# Patient Record
Sex: Female | Born: 1935 | Race: White | Hispanic: No
Health system: Southern US, Community
[De-identification: ages and names within clinical notes are randomized; demographics above are authoritative.]

---

## 2004-12-06 ENCOUNTER — Ambulatory Visit: Payer: Self-pay | Admitting: Internal Medicine

## 2005-06-20 ENCOUNTER — Inpatient Hospital Stay: Payer: Self-pay | Admitting: Internal Medicine

## 2005-06-22 ENCOUNTER — Other Ambulatory Visit: Payer: Self-pay

## 2005-07-28 ENCOUNTER — Other Ambulatory Visit: Payer: Self-pay

## 2005-07-28 ENCOUNTER — Emergency Department: Payer: Self-pay | Admitting: Emergency Medicine

## 2005-12-10 ENCOUNTER — Other Ambulatory Visit: Payer: Self-pay

## 2005-12-10 ENCOUNTER — Emergency Department: Payer: Self-pay

## 2006-02-06 ENCOUNTER — Ambulatory Visit: Payer: Self-pay | Admitting: Internal Medicine

## 2006-05-06 ENCOUNTER — Other Ambulatory Visit: Payer: Self-pay

## 2006-05-06 ENCOUNTER — Ambulatory Visit: Payer: Self-pay | Admitting: Urology

## 2006-05-12 ENCOUNTER — Ambulatory Visit: Payer: Self-pay | Admitting: Urology

## 2007-03-09 ENCOUNTER — Ambulatory Visit: Payer: Self-pay | Admitting: Unknown Physician Specialty

## 2007-07-14 ENCOUNTER — Emergency Department: Payer: Self-pay | Admitting: Emergency Medicine

## 2007-08-18 ENCOUNTER — Ambulatory Visit: Payer: Self-pay | Admitting: Internal Medicine

## 2007-12-01 ENCOUNTER — Ambulatory Visit: Payer: Self-pay

## 2008-05-21 ENCOUNTER — Inpatient Hospital Stay: Payer: Self-pay | Admitting: Internal Medicine

## 2008-07-26 ENCOUNTER — Ambulatory Visit: Payer: Self-pay | Admitting: Otolaryngology

## 2008-11-08 ENCOUNTER — Ambulatory Visit: Payer: Self-pay | Admitting: Internal Medicine

## 2008-12-27 ENCOUNTER — Inpatient Hospital Stay: Payer: Self-pay | Admitting: Internal Medicine

## 2009-07-18 ENCOUNTER — Encounter: Admission: RE | Admit: 2009-07-18 | Discharge: 2009-07-18 | Payer: Self-pay | Admitting: Neurology

## 2009-08-08 ENCOUNTER — Ambulatory Visit: Payer: Self-pay | Admitting: Family Medicine

## 2009-11-23 ENCOUNTER — Ambulatory Visit: Payer: Self-pay | Admitting: Family Medicine

## 2010-02-20 ENCOUNTER — Ambulatory Visit: Payer: Self-pay | Admitting: Family Medicine

## 2010-12-30 ENCOUNTER — Ambulatory Visit: Payer: Self-pay | Admitting: Family Medicine

## 2011-06-11 ENCOUNTER — Encounter: Payer: Self-pay | Admitting: Neurology

## 2011-06-18 ENCOUNTER — Encounter: Payer: Self-pay | Admitting: Neurology

## 2011-07-19 ENCOUNTER — Encounter: Payer: Self-pay | Admitting: Neurology

## 2012-01-14 ENCOUNTER — Ambulatory Visit: Payer: Self-pay | Admitting: Family Medicine

## 2013-01-17 ENCOUNTER — Ambulatory Visit: Payer: Self-pay | Admitting: Family Medicine

## 2013-05-16 ENCOUNTER — Ambulatory Visit: Payer: Self-pay | Admitting: Cardiology

## 2013-05-16 DIAGNOSIS — I4891 Unspecified atrial fibrillation: Secondary | ICD-10-CM

## 2013-05-16 DIAGNOSIS — I10 Essential (primary) hypertension: Secondary | ICD-10-CM

## 2013-05-16 LAB — CBC WITH DIFFERENTIAL/PLATELET
BASOS PCT: 0.7 %
Basophil #: 0 10*3/uL (ref 0.0–0.1)
EOS PCT: 2.3 %
Eosinophil #: 0.2 10*3/uL (ref 0.0–0.7)
HCT: 39.2 % (ref 35.0–47.0)
HGB: 12.7 g/dL (ref 12.0–16.0)
Lymphocyte #: 1.8 10*3/uL (ref 1.0–3.6)
Lymphocyte %: 24.7 %
MCH: 32.3 pg (ref 26.0–34.0)
MCHC: 32.4 g/dL (ref 32.0–36.0)
MCV: 100 fL (ref 80–100)
MONO ABS: 0.7 x10 3/mm (ref 0.2–0.9)
Monocyte %: 9.9 %
Neutrophil #: 4.5 10*3/uL (ref 1.4–6.5)
Neutrophil %: 62.4 %
Platelet: 129 10*3/uL — ABNORMAL LOW (ref 150–440)
RBC: 3.93 10*6/uL (ref 3.80–5.20)
RDW: 13.4 % (ref 11.5–14.5)
WBC: 7.2 10*3/uL (ref 3.6–11.0)

## 2013-05-16 LAB — PROTIME-INR
INR: 3
PROTHROMBIN TIME: 30.3 s — AB (ref 11.5–14.7)

## 2013-05-16 LAB — BASIC METABOLIC PANEL
Anion Gap: 4 — ABNORMAL LOW (ref 7–16)
BUN: 22 mg/dL — ABNORMAL HIGH (ref 7–18)
CALCIUM: 10.1 mg/dL (ref 8.5–10.1)
CREATININE: 1.6 mg/dL — AB (ref 0.60–1.30)
Chloride: 106 mmol/L (ref 98–107)
Co2: 32 mmol/L (ref 21–32)
EGFR (African American): 33 — ABNORMAL LOW
EGFR (Non-African Amer.): 28 — ABNORMAL LOW
GLUCOSE: 90 mg/dL (ref 65–99)
OSMOLALITY: 286 (ref 275–301)
Potassium: 3.7 mmol/L (ref 3.5–5.1)
Sodium: 142 mmol/L (ref 136–145)

## 2013-05-16 LAB — APTT: Activated PTT: 41.9 secs — ABNORMAL HIGH (ref 23.6–35.9)

## 2013-05-24 ENCOUNTER — Ambulatory Visit: Payer: Self-pay | Admitting: Cardiology

## 2013-05-24 LAB — PROTIME-INR
INR: 1.3
PROTHROMBIN TIME: 15.5 s — AB (ref 11.5–14.7)

## 2013-09-29 ENCOUNTER — Emergency Department: Payer: Self-pay | Admitting: Emergency Medicine

## 2013-09-29 LAB — URINALYSIS, COMPLETE
BACTERIA: NONE SEEN
Bilirubin,UR: NEGATIVE
Glucose,UR: NEGATIVE mg/dL (ref 0–75)
Ketone: NEGATIVE
Leukocyte Esterase: NEGATIVE
NITRITE: NEGATIVE
Ph: 5 (ref 4.5–8.0)
Protein: NEGATIVE
Specific Gravity: 1.011 (ref 1.003–1.030)
Squamous Epithelial: NONE SEEN

## 2013-09-29 LAB — COMPREHENSIVE METABOLIC PANEL
ALBUMIN: 3.1 g/dL — AB (ref 3.4–5.0)
ALT: 23 U/L
Alkaline Phosphatase: 140 U/L — ABNORMAL HIGH
Anion Gap: 9 (ref 7–16)
BILIRUBIN TOTAL: 0.5 mg/dL (ref 0.2–1.0)
BUN: 41 mg/dL — ABNORMAL HIGH (ref 7–18)
CHLORIDE: 109 mmol/L — AB (ref 98–107)
CO2: 24 mmol/L (ref 21–32)
CREATININE: 2.15 mg/dL — AB (ref 0.60–1.30)
Calcium, Total: 10.4 mg/dL — ABNORMAL HIGH (ref 8.5–10.1)
EGFR (African American): 23 — ABNORMAL LOW
EGFR (Non-African Amer.): 20 — ABNORMAL LOW
Glucose: 103 mg/dL — ABNORMAL HIGH (ref 65–99)
Osmolality: 293 (ref 275–301)
Potassium: 3.9 mmol/L (ref 3.5–5.1)
SGOT(AST): 25 U/L (ref 15–37)
Sodium: 142 mmol/L (ref 136–145)
Total Protein: 8.2 g/dL (ref 6.4–8.2)

## 2013-09-29 LAB — PROTIME-INR
INR: 1.8
Prothrombin Time: 20.1 secs — ABNORMAL HIGH (ref 11.5–14.7)

## 2013-09-29 LAB — CBC
HCT: 38 % (ref 35.0–47.0)
HGB: 12.2 g/dL (ref 12.0–16.0)
MCH: 32.5 pg (ref 26.0–34.0)
MCHC: 32.1 g/dL (ref 32.0–36.0)
MCV: 101 fL — AB (ref 80–100)
Platelet: 145 10*3/uL — ABNORMAL LOW (ref 150–440)
RBC: 3.74 10*6/uL — AB (ref 3.80–5.20)
RDW: 13.7 % (ref 11.5–14.5)
WBC: 10.6 10*3/uL (ref 3.6–11.0)

## 2013-09-29 LAB — PRO B NATRIURETIC PEPTIDE: B-Type Natriuretic Peptide: 2885 pg/mL — ABNORMAL HIGH (ref 0–450)

## 2013-09-29 LAB — TROPONIN I

## 2013-10-01 LAB — URINE CULTURE

## 2014-06-10 NOTE — Op Note (Signed)
PATIENT NAME:  Kelsey Harrison, [name] MR#:  [id number] DATE OF BIRTH:  03-18-1921  DATE OF PROCEDURE:  05/24/2013  PRIMARY CARE PHYSICIAN:  Dr. Elease HashimotoMaloney.   PREPROCEDURE DIAGNOSES: 1.  Sick sinus syndrome.  2.  Elective replacement indication.   PROCEDURE:  Dual-chamber pacemaker generator change out.   POSTPROCEDURE DIAGNOSIS:  Intermittent ventricular pacing.   INDICATION:  The patient is an 81 year old female with known history of sick sinus syndrome, status post pacemaker. The patient underwent recent pacemaker interrogation which showed the pacemaker was at elective replacement indication with a slow ventricular escape rhythm. Procedure, risks, benefits and alternatives of pacemaker generator change out were explained to the patient and informed written consent was obtained.   She was brought to the operating room in a fasting state. The left pectoral region was prepped and draped in the usual sterile manner. Anesthesia was obtained with 1% lidocaine locally. A 6 cm incision was performed over the left pectoral region. The pacemaker pocket was retrieved by electrocautery and blunt dissection. The leads were disconnected from the old pacemaker generator and connected to a new dual-chamber rate responsive pacemaker generator. Pacemaker pocket was irrigated with gentamicin solution. The new pacemaker generator was positioned into the pocket. The pocket was closed with 2-0 and 4-0 Vicryl, respectively. Steri-Strips and pressure dressing were applied.    ____________________________ Marcina MillardAlexander Rakwon Letourneau, MD ap:dmm D: 05/24/2013 12:19:47 ET T: 05/24/2013 12:41:11 ET JOB#: [id number]  cc: Marcina MillardAlexander Irina Okelly, MD, <Dictator> Marcina MillardALEXANDER Bryona Foxworthy MD ELECTRONICALLY SIGNED 06/21/2013 12:43

## 2017-12-30 ENCOUNTER — Other Ambulatory Visit: Payer: Self-pay | Admitting: Student

## 2017-12-30 DIAGNOSIS — R1319 Other dysphagia: Secondary | ICD-10-CM

## 2017-12-30 DIAGNOSIS — R1310 Dysphagia, unspecified: Secondary | ICD-10-CM

## 2018-01-01 ENCOUNTER — Ambulatory Visit: Payer: PRIVATE HEALTH INSURANCE

## 2018-01-11 ENCOUNTER — Ambulatory Visit: Payer: PRIVATE HEALTH INSURANCE

## 2018-02-01 ENCOUNTER — Ambulatory Visit
Admission: RE | Admit: 2018-02-01 | Discharge: 2018-02-01 | Disposition: A | Discharge status: Home / Self Care | Payer: Medicare Other | Source: Ambulatory Visit | Attending: Student | Admitting: Student

## 2018-02-01 DIAGNOSIS — R1310 Dysphagia, unspecified: Secondary | ICD-10-CM | POA: Insufficient documentation

## 2018-02-01 DIAGNOSIS — R1319 Other dysphagia: Secondary | ICD-10-CM

## 2018-02-03 ENCOUNTER — Other Ambulatory Visit: Payer: Self-pay | Admitting: Student

## 2018-02-03 DIAGNOSIS — R1310 Dysphagia, unspecified: Secondary | ICD-10-CM

## 2018-02-03 DIAGNOSIS — R933 Abnormal findings on diagnostic imaging of other parts of digestive tract: Secondary | ICD-10-CM

## 2018-03-01 ENCOUNTER — Ambulatory Visit
Admission: RE | Admit: 2018-03-01 | Discharge: 2018-03-01 | Disposition: A | Discharge status: Home / Self Care | Payer: Medicare Other | Source: Ambulatory Visit | Attending: Student | Admitting: Student

## 2018-03-01 DIAGNOSIS — R1312 Dysphagia, oropharyngeal phase: Secondary | ICD-10-CM | POA: Diagnosis not present

## 2018-03-01 DIAGNOSIS — R933 Abnormal findings on diagnostic imaging of other parts of digestive tract: Secondary | ICD-10-CM | POA: Diagnosis present

## 2018-03-01 NOTE — Therapy (Signed)
[facility] DIAGNOSTIC RADIOLOGY [address] [place], [place], [address] Phone: [phone]   Fax:     Modified Barium Swallow  Patient Details  Name: Kelsey Harrison MRN: [id number] Date of Birth: [birth year] No data recorded  Encounter Date: 03/01/2018  End of Session - 03/01/18 1328    Visit Number  1    Number of Visits  1    Date for SLP Re-Evaluation  03/01/18    SLP Start Time  1240    SLP Stop Time   1328    SLP Time Calculation (min)  48 min    Activity Tolerance  Patient tolerated treatment well       No past medical history on file.    There were no vitals filed for this visit.    Subjective: Patient behavior: (alertness, ability to follow instructions, etc.):  The patient is alert and oriented to swallowing.  She is able to follow simple directions.  She cannot coherently verbalize her swallowing complaints.  Chief complaint: patient reports burning in throat.  Patient's family report regurgitation, early satiety, foods/pills getting stuck   Objective:  Radiological Procedure: A videoflouroscopic evaluation of oral-preparatory, reflex initiation, and pharyngeal phases of the swallow was performed; as well as a screening of the upper esophageal phase.  I. POSTURE: Upright in MBS chair  II. VIEW: Lateral  III. COMPENSATORY STRATEGIES: N/A  IV. BOLUSES ADMINISTERED:   Thin Liquid: 3 consecutive   Nectar-thick Liquid: 2 consecutive   Honey-thick Liquid: DNT   Puree: 2 teaspoon presentations   Mechanical Soft: 1/4 graham cracker crumbled in applesauce  V. RESULTS OF EVALUATION: A. ORAL PREPARATORY PHASE: (The lips, tongue, and velum are observed for strength and coordination)       **Overall Severity Rating: Mild; disorganization appears to be primarily due to ill-fitting dentures  B. SWALLOW INITIATION/REFLEX: (The reflex is normal if "triggered" by the time the bolus reached the base of the tongue)  **Overall  Severity Rating: Mild-moderate; triggers while falling from the valleculae to the pyriform sinuses  C. PHARYNGEAL PHASE: (Pharyngeal function is normal if the bolus shows rapid, smooth, and continuous transit through the pharynx and there is no pharyngeal residue after the swallow)  **Overall Severity Rating: Mild; reduced pharyngeal pressure generation (reduced tongue base retraction, reduced hyolaryngeal excursion) with moderate vallecular residue with solids and mild with liquids  D. LARYNGEAL PENETRATION: (Material entering into the laryngeal inlet/vestibule but not aspirated) NONE  E. ASPIRATION: NONE  F. ESOPHAGEAL PHASE: (Screening of the upper esophagus) difficult to view cervical esophagus due to body habitus  ASSESSMENT: This 85 year old woman; with aspiration during barium swallow study; is presenting with mild oropharyngeal dysphagia characterized by disorganized oral phase secondary ill-fitting dentures, delayed pharyngeal swallow initiation, reduced pharyngeal pressure generation, and moderate vallecular residue with solids (less with liquid).  There is no observed laryngeal penetration or tracheal aspiration.  The patient is not at significant risk for prandial aspiration under normal eating situations.  The patient does NOT require a chin tuck to prevent aspiration.  The patient's complaints- early satiety, regurgitation, foods/pills sticking, burning in throat- may represent esophageal symptoms.  Discussed with the patient's family basic swallowing precautions including: small more frequent meals, softer and moister foods, be upright for meals, stay upright for an hour after meals, meds crushed in puree with plenty of fluid, alternate solids and liquids.  PLAN/RECOMMENDATIONS:   A. Diet: Dysphagia 2-3 diet with thin liquids; soften/moisten foods for ease in swallowing  B. Swallowing Precautions: small more frequent meals, softer and moister foods, be upright for meals, stay upright  for an hour after meals, meds crushed in puree with plenty of fluid, alternate solids and liquids.   C. Recommended consultation to: May benefit form referral to ENT   D. Therapy recommendations: none at this time   E. Results and recommendations were discussed with the patient's family immediately following the study and the final report routed to the referring practitioner and the patient's PCP.    Oropharyngeal dysphagia - Plan: DG OP Swallowing Func-Medicare/Speech Path, DG OP Swallowing Func-Medicare/Speech Path  Abnormal barium swallow - Plan: DG OP Swallowing Func-Medicare/Speech Path, DG OP Swallowing Func-Medicare/Speech Path        Problem List There are no active problems to display for this patient.  Kelsey PrimroseSusan G Coby Antrobus, MS/CCC- SLP  Leandrew KoyanagiAbernathy, Kelsey 03/01/2018, 1:29 PM  [facility] DIAGNOSTIC RADIOLOGY [address] [place], [place], [id number] Phone: [phone]   Fax:     Name: Kelsey MakoMabel I Harrison MRN: [id number] Date of Birth: 08/13/1921

## 2019-07-10 ENCOUNTER — Emergency Department
Admission: EM | Admit: 2019-07-10 | Discharge: 2019-07-10 | Disposition: A | Discharge status: Other Acute Inpatient Hospital | Payer: Medicare Other | Attending: Student | Admitting: Student

## 2019-07-10 ENCOUNTER — Emergency Department: Payer: Medicare Other

## 2019-07-10 ENCOUNTER — Other Ambulatory Visit: Payer: Self-pay

## 2019-07-10 DIAGNOSIS — I4891 Unspecified atrial fibrillation: Secondary | ICD-10-CM | POA: Diagnosis not present

## 2019-07-10 DIAGNOSIS — S300XXA Contusion of lower back and pelvis, initial encounter: Secondary | ICD-10-CM | POA: Diagnosis not present

## 2019-07-10 DIAGNOSIS — N189 Chronic kidney disease, unspecified: Secondary | ICD-10-CM | POA: Diagnosis not present

## 2019-07-10 DIAGNOSIS — Z20822 Contact with and (suspected) exposure to covid-19: Secondary | ICD-10-CM | POA: Insufficient documentation

## 2019-07-10 DIAGNOSIS — S32591A Other specified fracture of right pubis, initial encounter for closed fracture: Secondary | ICD-10-CM | POA: Insufficient documentation

## 2019-07-10 DIAGNOSIS — Y92122 Bedroom in nursing home as the place of occurrence of the external cause: Secondary | ICD-10-CM | POA: Diagnosis not present

## 2019-07-10 DIAGNOSIS — Y9301 Activity, walking, marching and hiking: Secondary | ICD-10-CM | POA: Insufficient documentation

## 2019-07-10 DIAGNOSIS — W19XXXA Unspecified fall, initial encounter: Secondary | ICD-10-CM | POA: Diagnosis not present

## 2019-07-10 DIAGNOSIS — Z7901 Long term (current) use of anticoagulants: Secondary | ICD-10-CM | POA: Insufficient documentation

## 2019-07-10 DIAGNOSIS — Y999 Unspecified external cause status: Secondary | ICD-10-CM | POA: Insufficient documentation

## 2019-07-10 DIAGNOSIS — S329XXA Fracture of unspecified parts of lumbosacral spine and pelvis, initial encounter for closed fracture: Secondary | ICD-10-CM

## 2019-07-10 DIAGNOSIS — N9489 Other specified conditions associated with female genital organs and menstrual cycle: Secondary | ICD-10-CM

## 2019-07-10 DIAGNOSIS — S79911A Unspecified injury of right hip, initial encounter: Secondary | ICD-10-CM | POA: Diagnosis present

## 2019-07-10 DIAGNOSIS — Z950 Presence of cardiac pacemaker: Secondary | ICD-10-CM | POA: Insufficient documentation

## 2019-07-10 DIAGNOSIS — I129 Hypertensive chronic kidney disease with stage 1 through stage 4 chronic kidney disease, or unspecified chronic kidney disease: Secondary | ICD-10-CM | POA: Diagnosis not present

## 2019-07-10 LAB — HEMOGLOBIN AND HEMATOCRIT, BLOOD
HCT: 27.8 % — ABNORMAL LOW (ref 36.0–46.0)
Hemoglobin: 8.3 g/dL — ABNORMAL LOW (ref 12.0–15.0)

## 2019-07-10 LAB — CBC WITH DIFFERENTIAL/PLATELET
Abs Immature Granulocytes: 0.12 10*3/uL — ABNORMAL HIGH (ref 0.00–0.07)
Basophils Absolute: 0 10*3/uL (ref 0.0–0.1)
Basophils Relative: 0 %
Eosinophils Absolute: 0.1 10*3/uL (ref 0.0–0.5)
Eosinophils Relative: 1 %
HCT: 39.8 % (ref 36.0–46.0)
Hemoglobin: 12.7 g/dL (ref 12.0–15.0)
Immature Granulocytes: 2 %
Lymphocytes Relative: 31 %
Lymphs Abs: 2.4 10*3/uL (ref 0.7–4.0)
MCH: 31.6 pg (ref 26.0–34.0)
MCHC: 31.9 g/dL (ref 30.0–36.0)
MCV: 99 fL (ref 80.0–100.0)
Monocytes Absolute: 0.2 10*3/uL (ref 0.1–1.0)
Monocytes Relative: 3 %
Neutro Abs: 4.9 10*3/uL (ref 1.7–7.7)
Neutrophils Relative %: 63 %
Platelets: 113 10*3/uL — ABNORMAL LOW (ref 150–400)
RBC: 4.02 MIL/uL (ref 3.87–5.11)
RDW: 13.6 % (ref 11.5–15.5)
WBC: 7.6 10*3/uL (ref 4.0–10.5)
nRBC: 0 % (ref 0.0–0.2)

## 2019-07-10 LAB — BASIC METABOLIC PANEL
Anion gap: 9 (ref 5–15)
BUN: 25 mg/dL — ABNORMAL HIGH (ref 8–23)
CO2: 23 mmol/L (ref 22–32)
Calcium: 9.5 mg/dL (ref 8.9–10.3)
Chloride: 105 mmol/L (ref 98–111)
Creatinine, Ser: 1.51 mg/dL — ABNORMAL HIGH (ref 0.44–1.00)
GFR calc Af Amer: 34 mL/min — ABNORMAL LOW (ref >60)
GFR calc non Af Amer: 29 mL/min — ABNORMAL LOW (ref >60)
Glucose, Bld: 102 mg/dL — ABNORMAL HIGH (ref 70–99)
Potassium: 4.7 mmol/L (ref 3.5–5.1)
Sodium: 137 mmol/L (ref 135–145)

## 2019-07-10 LAB — PROTIME-INR
INR: 2.2 — ABNORMAL HIGH (ref 0.8–1.2)
Prothrombin Time: 23.6 seconds — ABNORMAL HIGH (ref 11.4–15.2)

## 2019-07-10 LAB — PREPARE RBC (CROSSMATCH)

## 2019-07-10 LAB — ABO/RH: ABO/RH(D): A NEG

## 2019-07-10 LAB — SARS CORONAVIRUS 2 BY RT PCR (HOSPITAL ORDER, PERFORMED IN ~~LOC~~ HOSPITAL LAB): SARS Coronavirus 2: NEGATIVE

## 2019-07-10 MED ORDER — VITAMIN K1 10 MG/ML IJ SOLN
5.0000 mg | Freq: Once | INTRAVENOUS | Status: DC
  Filled 2019-07-10: qty 0.5

## 2019-07-10 MED ORDER — SODIUM CHLORIDE 0.9 % IV SOLN
50.00 | INTRAVENOUS | Status: DC
Start: ? — End: 2019-07-10

## 2019-07-10 MED ORDER — METOPROLOL TARTRATE 5 MG/5ML IV SOLN: 5.0000 mg | Freq: Once | INTRAVENOUS | Status: DC

## 2019-07-10 MED ORDER — DILTIAZEM HCL-DEXTROSE 125-5 MG/125ML-% IV SOLN (PREMIX)
5.0000 mg/h | INTRAVENOUS | Status: DC
  Administered 2019-07-10: 5 mg/h via INTRAVENOUS
  Filled 2019-07-10: qty 125

## 2019-07-10 MED ORDER — DILTIAZEM HCL 25 MG/5ML IV SOLN
10.0000 mg | Freq: Once | INTRAVENOUS | Status: AC
  Administered 2019-07-10: 10 mg via INTRAVENOUS
  Filled 2019-07-10: qty 5

## 2019-07-10 MED ORDER — LACTATED RINGERS IV SOLN
75.00 | INTRAVENOUS | Status: DC
Start: ? — End: 2019-07-10

## 2019-07-10 MED ORDER — ONDANSETRON HCL 4 MG/2ML IJ SOLN
4.0000 mg | Freq: Once | INTRAMUSCULAR | Status: AC
  Administered 2019-07-10: 4 mg via INTRAVENOUS
  Filled 2019-07-10: qty 2

## 2019-07-10 MED ORDER — SODIUM CHLORIDE 0.9% IV SOLUTION
Freq: Once | INTRAVENOUS | Status: AC
  Filled 2019-07-10: qty 250

## 2019-07-10 MED ORDER — MORPHINE SULFATE (PF) 2 MG/ML IV SOLN
2.0000 mg | Freq: Once | INTRAVENOUS | Status: AC
  Administered 2019-07-10: 2 mg via INTRAVENOUS
  Filled 2019-07-10: qty 1

## 2019-07-10 MED ORDER — FENTANYL CITRATE (PF) 50 MCG/ML IJ SOLN
25.00 | INTRAMUSCULAR | Status: DC
Start: ? — End: 2019-07-10

## 2019-07-10 MED ORDER — ACETAMINOPHEN 500 MG PO TABS
500.00 | ORAL_TABLET | ORAL | Status: DC
Start: ? — End: 2019-07-10

## 2019-07-10 MED ORDER — SODIUM CHLORIDE 0.9 % IV BOLUS
1000.0000 mL | Freq: Once | INTRAVENOUS | Status: AC
  Administered 2019-07-10: 1000 mL via INTRAVENOUS

## 2019-07-10 MED ORDER — METOPROLOL TARTRATE 25 MG PO TABS
25.0000 mg | ORAL_TABLET | Freq: Once | ORAL | Status: AC
  Administered 2019-07-10: 25 mg via ORAL
  Filled 2019-07-10: qty 1

## 2019-07-10 MED ORDER — DILTIAZEM HCL 60 MG PO TABS
30.0000 mg | ORAL_TABLET | Freq: Once | ORAL | Status: AC
  Administered 2019-07-10: 30 mg via ORAL
  Filled 2019-07-10: qty 1

## 2019-07-10 MED ORDER — MAGNESIUM SULFATE 2 GM/50ML IV SOLN
2.0000 g | Freq: Once | INTRAVENOUS | Status: AC
  Administered 2019-07-10: 2 g via INTRAVENOUS
  Filled 2019-07-10: qty 50

## 2019-07-10 NOTE — ED Provider Notes (Signed)
[facility] Emergency Department Provider Note  ____________________________________________  Time seen: Approximately 6:12 AM  I have reviewed the triage vital signs and the nursing notes.   HISTORY  Chief Complaint Fall and Hip Pain   HPI Kelsey Harrison is a 86 y.o. female with history of chronic kidney disease, sick sinus syndrome status post pacemaker, atrial fibrillation on Coumadin, hypertension who presents  for evaluation of right hip pain status post mechanical fall.  Patient reports that she got up to go to the bathroom.  Her legs gave out and she fell onto the right hip.  She is complaining of pain in the right hip and right elbow.  She denies hitting her head, LOC, neck pain or back pain.  Her pain is mild but becomes moderate with movement of the hip.  She denies feeling dizzy and reports that her fall was mechanical in nature.  Patient has no history of dementia.  PMH Chronic kidney disease 09/29/2013  Major depressive disorder, single episode 09/29/2013  Hypertension 09/09/2013  Sick sinus syndrome 09/09/2013  Last Assessment & Plan:   Formatting of this note might be different from the original. Med[employer]onic Adapta ADDR06 pacemaker in the vvi mode lower rate of 50, ADL rate of 95.   Arthritis 09/09/2013  A-fib      Allergies Inderal [propranolol] and Sulfa antibiotics  FH Myocardial Infarction (Heart attack) Father    Vaginal cancer Mother       Social History Smoking - never Alcohol - no Drugs - no  Review of Systems  Constitutional: Negative for fever. Eyes: Negative for visual changes. ENT: Negative for facial injury or neck injury Cardiovascular: Negative for chest injury. Respiratory: Negative for shortness of breath. Negative for chest wall injury. Gastrointestinal: Negative for abdominal pain or injury. Genitourinary: Negative for dysuria. Musculoskeletal: Negative for back injury, + R hip pain and R elbow  pain Skin: Negative for laceration/abrasions. Neurological: Negative for head injury.   ____________________________________________   PHYSICAL EXAM:  VITAL SIGNS: ED Triage Vitals  Enc Vitals Group     BP 07/10/19 0509 (!) 148/96     Pulse Rate 07/10/19 0509 (!) 108     Resp 07/10/19 0509 (!) 22     Temp 07/10/19 0509 98.2 F (36.8 C)     Temp Source 07/10/19 0509 Oral     SpO2 07/10/19 0509 95 %     Weight 07/10/19 0505 83 lb (37.6 kg)     Height 07/10/19 0505 5' (1.524 m)     Head Circumference --      Peak Flow --      Pain Score --      Pain Loc --      Pain Edu? --      Excl. in GC? --     Full spinal precautions maintained throughout the trauma exam. Constitutional: Alert and oriented. No acute distress. Does not appear intoxicated. HEENT Head: Normocephalic and atraumatic. Face: No facial bony tenderness. Stable midface Ears: No hemotympanum bilaterally. No Battle sign Eyes: No eye injury. PERRL. No raccoon eyes Nose: Nontender. No epistaxis. No rhinorrhea Mouth/Throat: Mucous membranes are moist. No oropharyngeal blood. No dental injury. Airway patent without stridor. Normal voice. Neck: no C-collar. No midline c-spine tenderness.  Cardiovascular: Irregularly irregular rhythm with tachycardic rate  pulmonary/Chest: Chest wall is stable and nontender to palpation/compression. Normal respiratory effort. Breath sounds are normal. No crepitus.  Abdominal: Soft, nontender, non distended. Musculoskeletal: Tenderness to palpation of the right hip.  Bruising of the right elbow.  nontender with normal full range of motion in all other extremities. No deformities. No thoracic or lumbar midline spinal tenderness. Pelvis is stable. Skin: Skin is warm, dry and intact. No abrasions or contutions. Psychiatric: Speech and behavior are appropriate. Neurological: Normal speech and language. Moves all extremities to command. No gross focal neurologic deficits are  appreciated.  Glascow Coma Score: 4 - Opens eyes on own 6 - Follows simple motor commands 5 - Alert and oriented GCS: 15   ____________________________________________   LABS (all labs ordered are listed, but only abnormal results are displayed)  Labs Reviewed  CBC WITH DIFFERENTIAL/PLATELET - Abnormal; Notable for the following components:      Result Value   Platelets 113 (*)    Abs Immature Granulocytes 0.12 (*)    All other components within normal limits  PROTIME-INR - Abnormal; Notable for the following components:   Prothrombin Time 23.6 (*)    INR 2.2 (*)    All other components within normal limits  BASIC METABOLIC PANEL - Abnormal; Notable for the following components:   Glucose, Bld 102 (*)    BUN 25 (*)    Creatinine, Ser 1.51 (*)    GFR calc non Af Amer 29 (*)    GFR calc Af Amer 34 (*)    All other components within normal limits  URINALYSIS, COMPLETE (UACMP) WITH MICROSCOPIC  TYPE AND SCREEN   ____________________________________________  EKG  ED ECG REPORT I, Rudene Re, the attending physician, personally viewed and interpreted this ECG.  A. fib with rate of 112, normal QTC, right axis deviation, anterior Q waves.   ____________________________________________  RADIOLOGY  I have personally reviewed the images performed during this visit and I agree with the Radiologist's read.   Interpretation by Radiologist:  DG Elbow Complete Right  Result Date: 07/10/2019 CLINICAL DATA:  Initial evaluation for acute trauma, fall. EXAM: RIGHT ELBOW - COMPLETE 3+ VIEW COMPARISON:  None. FINDINGS: No acute fracture dislocation. No visible joint effusion. Mild degenerative spurring noted about the elbow. Osteopenia. No visible soft tissue injury. IMPRESSION: 1. No acute fracture or dislocation. 2. Mild degenerative osteoarthrosis. Electronically Signed   By: Jeannine Boga M.D.   On: 07/10/2019 06:35   DG Hip Unilat W or Wo Pelvis 2-3 Views  Right  Result Date: 07/10/2019 CLINICAL DATA:  Initial evaluation for acute trauma, fall. EXAM: DG HIP (WITH OR WITHOUT PELVIS) 2-3V RIGHT COMPARISON:  None. FINDINGS: Examination somewhat technically limited due to overlying catheter. There are acute comminuted and mildly displaced fractures involving the right superior and inferior pubic rami. Pubis symphysis remains grossly approximated. Remainder of the visualized bony pelvis intact. No other visible acute osseous abnormality about the right hip. No visible soft tissue injury. Diffuse osteopenia. IMPRESSION: Acute comminuted and mildly displaced fractures involving the right superior and inferior pubic rami. Electronically Signed   By: Jeannine Boga M.D.   On: 07/10/2019 06:33     ____________________________________________   PROCEDURES  Procedure(s) performed:yes .1-3 Lead EKG Interpretation Performed by: Rudene Re, MD Authorized by: Rudene Re, MD     Interpretation: abnormal     Rhythm: atrial fibrillation     Ectopy: none     Conduction: normal     Critical Care performed: yes  CRITICAL CARE Performed by: Rudene Re  ?  Total critical care time: 40 min  Critical care time was exclusive of separately billable procedures and treating other patients.  Critical care was necessary to treat  or prevent imminent or life-threatening deterioration.  Critical care was time spent personally by me on the following activities: development of treatment plan with patient and/or surrogate as well as nursing, discussions with consultants, evaluation of patient's response to treatment, examination of patient, obtaining history from patient or surrogate, ordering and performing treatments and interventions, ordering and review of laboratory studies, ordering and review of radiographic studies, pulse oximetry and re-evaluation of patient's condition.  ____________________________________________   INITIAL  IMPRESSION / ASSESSMENT AND PLAN / ED COURSE  86 y.o. female with history of chronic kidney disease, sick sinus syndrome status post pacemaker, atrial fibrillation on Coumadin, hypertension who presents  for evaluation of right hip pain status post mechanical fall.  Patient has full recollection of the fall and no history of dementia.  She fell onto her buttock and is complaining of right hip and right elbow pain.  No head trauma or LOC.  No neck pain or back pain.  X-ray reviewed with no signs of hip fracture but did show pelvic fracture, confirmed by radiology.  Patient is significant amount of pain therefore we will send for a CT pelvis to rule out occult fracture.  Elbow x-ray negative, confirmed by radiology.  On reevaluation patient is now in A. fib with RVR with rates as high as 190 and hypotensive with BP in the 60s.  IV fluid bolus was started and patient was given Cardizem IV 5 mg x 2 with significant improvement of her heart rate and blood pressure. Patient's mental status remains stable. IV magnesium also given for rate control. Home cardizem and lopressor PO given. Labs with no significant abnormalities, stable kidney function, therapeutic INR, normal potassium.  History gathered from patient and her son was at bedside.  Patient came with a copy of her DNR documentation from the nursing home but not the original.  Her son has confirmed the patient is DNR/DNI.  At this time she is pending CT for admission.  Care transferred to Dr. Colon Branch.   Old medical records reviewed     ____________________________________________  Please note:  Patient was evaluated in Emergency Department today for the symptoms described in the history of present illness. Patient was evaluated in the context of the global COVID-19 pandemic, which necessitated consideration that the patient might be at risk for infection with the SARS-CoV-2 virus that causes COVID-19. Institutional protocols and algorithms that pertain to  the evaluation of patients at risk for COVID-19 are in a state of rapid change based on information released by regulatory bodies including the CDC and federal and state organizations. These policies and algorithms were followed during the patient's care in the ED.  Some ED evaluations and interventions may be delayed as a result of limited staffing during the pandemic.   ____________________________________________   FINAL CLINICAL IMPRESSION(S) / ED DIAGNOSES   Final diagnoses:  Fall, initial encounter  Closed nondisplaced fracture of pelvis, unspecified part of pelvis, initial encounter (HCC)  Atrial fibrillation with RVR (HCC)      NEW MEDICATIONS STARTED DURING THIS VISIT:  ED Discharge Orders    None       Note:  This document was prepared using Dragon voice recognition software and may include unintentional dictation errors.    Don Perking, [place], MD 07/10/19 [phone]

## 2019-07-10 NOTE — ED Triage Notes (Signed)
Patient to ED from local nsg home after a fall.  Patient with right hip pain.  Patient states she was attempting to go to the bathroom.

## 2019-07-10 NOTE — ED Provider Notes (Signed)
Patient with modest improvement in her blood pressure with heart rate control, however HR remains fairly labile with recurrence of RVR and subsequent drop in her blood pressures.  She is mentating well throughout this.  Received 2 L IV fluids for likely some element of volume depletion given her BUN/Cr - also consider possible blood loss given her fractures.  Awaiting CT. Preliminarily discussed with ICU for admission, recommended step down status pending imaging.   9:40 AM CT concerning for noted rami fractures as well as pelvic hematoma that extends into the retroperitoneum and into the musculature of the right thigh.  CT also concerning for a possible bladder injury.  In the setting of her hypotension and tachycardia, unresponsive to fluids, feel she requires transfusion.  Son consented at bedside. Will start emergent transfusion given her hemodynamics.  Additionally, she is anticoagulated on warfarin, with INR 2.2.  Will plan to reverse this given concern for ongoing bleeding, IV vitamin K ordered.  Discussed w/ the son at bedside that the patient will require higher level of care due to her traumatic injuries and ongoing bleeding.  He will discuss with his wife regarding their desired hospital of transfer.  10:15 AM Son and family request [facility] for transfer. Discussed with [facility], Dr. Lenon Ahmadi who accepts patient as trauma transfer. Will elect for helicopter transport given her clinical status.  Updated son at bedside.  He voices understanding and is in agreement with the plan.  10:33 AM Repeat hemoglobin 8.3 from 12.7 previously. Blood transfusing.  10:50 AM Transport arrived for [facility].  BP improving w/ transfusion. Updated son and son's wife at bedside, all questions answered.    Radiology CT Pelvis - IMPRESSION:  Right superior and inferior pubic rami fractures with associated  right sacral fractures. This causes a pelvic hematoma which extends  superiorly in the retroperitoneum bilaterally but  particularly on  the right. This also extends inferiorly into the musculature of the  right thigh.   Hyperdense fluid level within the bladder consistent with hemorrhage  this is likely related to the recent pelvic fracture. The degree of  hemorrhage as well as the soft tissue changes in the pelvis raise  suspicion for possible bladder injury. Foley catheter placement with  dilute CT cystography may be helpful for further workup.     .Critical Care Performed by: Miguel Aschoff., MD Authorized by: Miguel Aschoff., MD   Critical care provider statement:    Critical care time (minutes):  45   Critical care was time spent personally by me on the following activities:  Discussions with consultants, evaluation of patient's response to treatment, examination of patient, ordering and performing treatments and interventions, ordering and review of laboratory studies, ordering and review of radiographic studies, pulse oximetry, re-evaluation of patient's condition and obtaining history from patient or surrogate      Miguel Aschoff., MD 07/10/19 1132

## 2019-07-10 NOTE — ED Notes (Signed)
First bolus administered prior to this RN arrival not previously documented due to pt sudden drop in BP. First bolus complete at this time, second bolus being administered at this time

## 2019-07-10 NOTE — ED Notes (Signed)
Transport arrived to transport to [facility] via [facility]

## 2019-07-10 NOTE — ED Notes (Signed)
Bladder scan showing 28 ml of urine in bladder, MD notified.

## 2019-07-10 NOTE — ED Notes (Signed)
April, RN attempted to insert foley cath without success, MD made aware.

## 2019-07-10 NOTE — ED Notes (Addendum)
Rate of blood infusion increased on both units to 256ml/hr per MD request

## 2019-07-11 LAB — TYPE AND SCREEN
ABO/RH(D): A NEG
Antibody Screen: NEGATIVE
Unit division: 0
Unit division: 0

## 2019-07-11 LAB — BPAM RBC
Blood Product Expiration Date: 202106272359
Blood Product Expiration Date: 202106282359
ISSUE DATE / TIME: 202105231009
ISSUE DATE / TIME: 202105231009
Unit Type and Rh: 5100
Unit Type and Rh: 5100

## 2019-07-19 DEATH — deceased

## 2019-08-18 DEATH — deceased

## 2020-12-11 IMAGING — DX DG ELBOW COMPLETE 3+V*R*
4 series · 4 of 4 positions shown · non-contrast
Comparison: None.

CLINICAL DATA: Initial evaluation for acute trauma, fall.

EXAM:
RIGHT ELBOW - COMPLETE 3+ VIEW

[elbow ap]
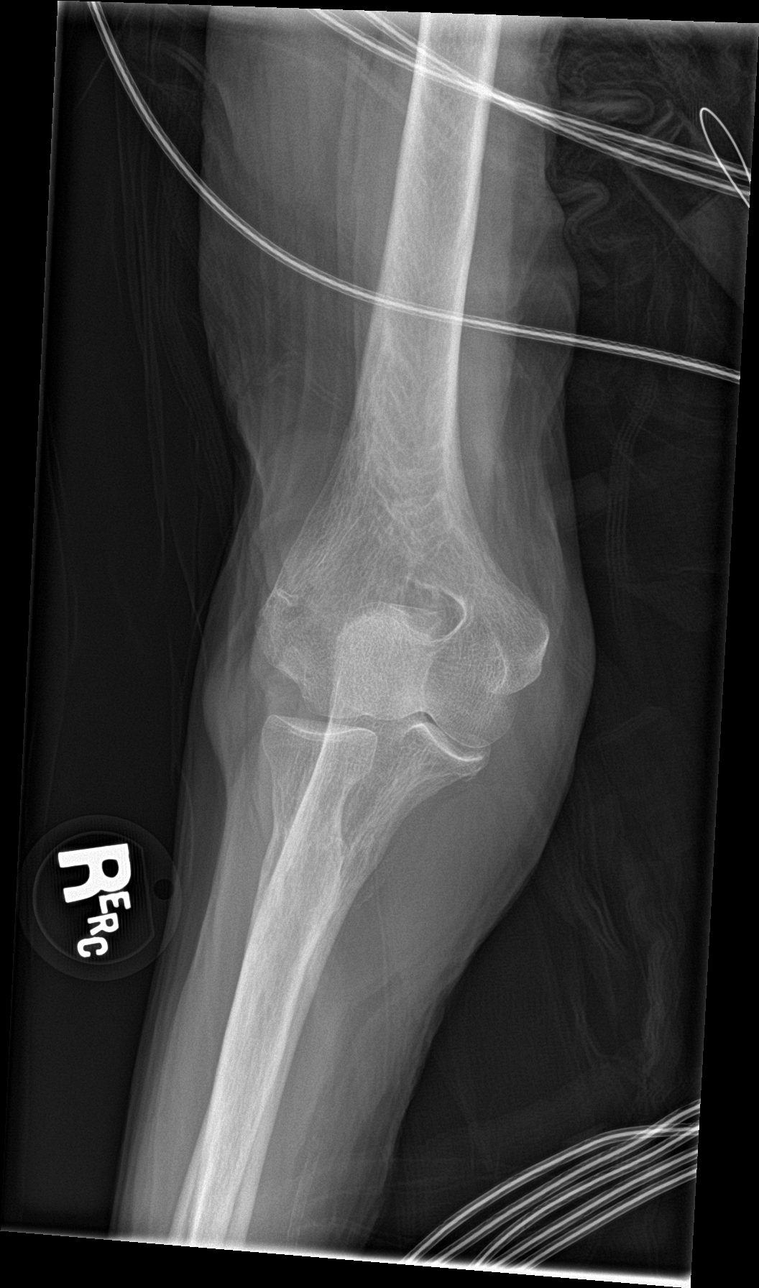

[elbow obl (1 of 2)]
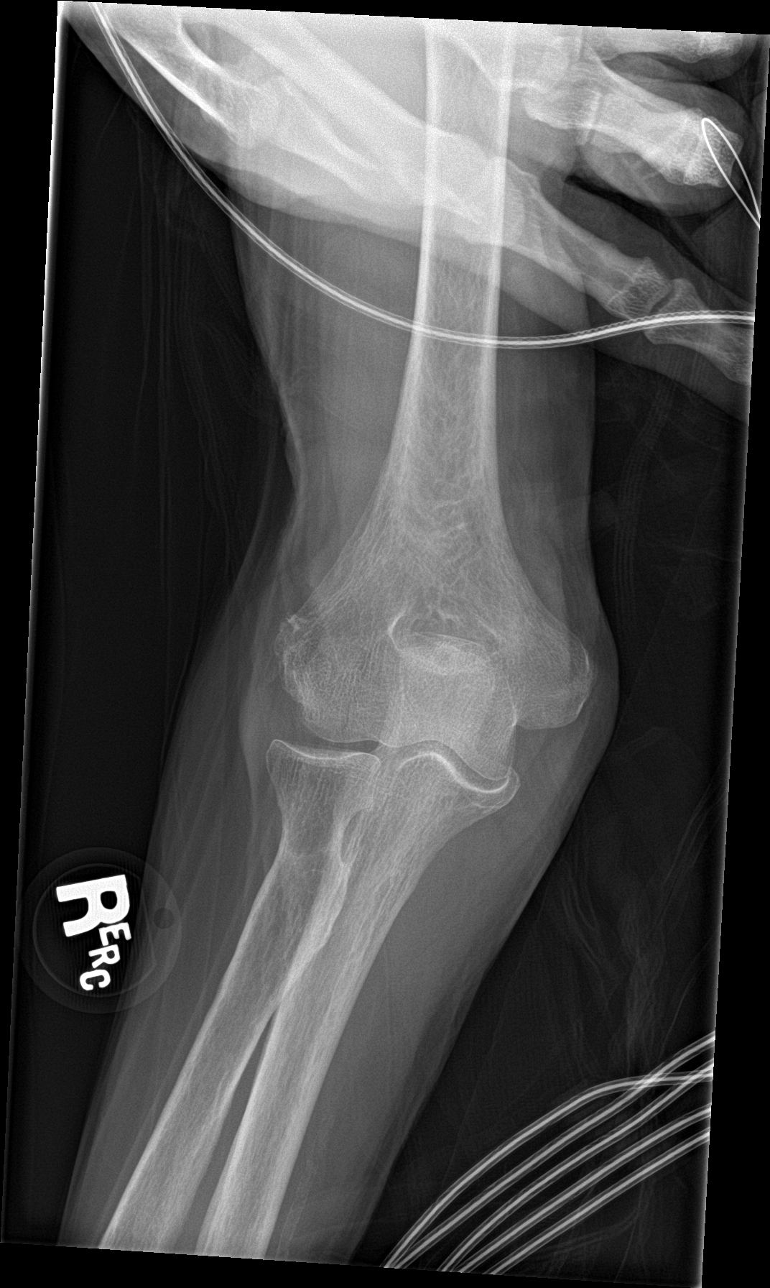

[elbow obl (2 of 2)]
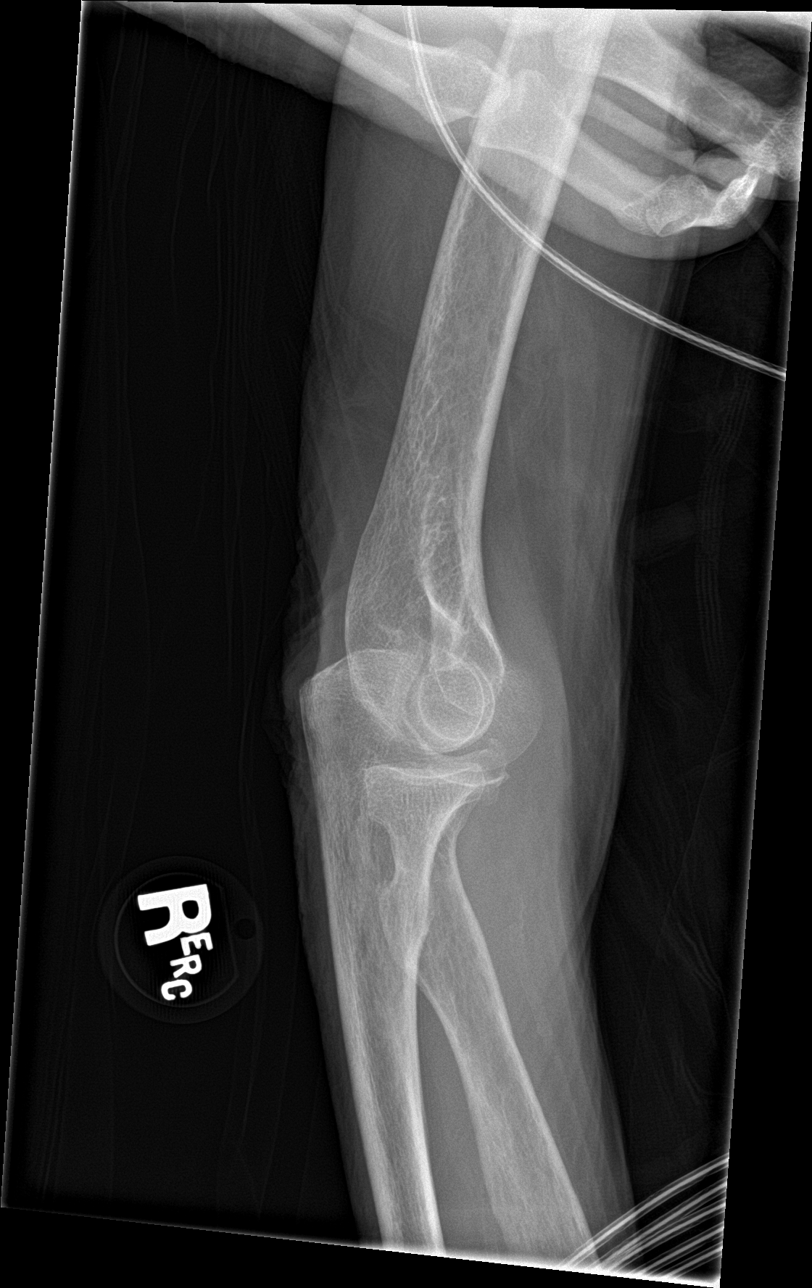

[elbow lat]
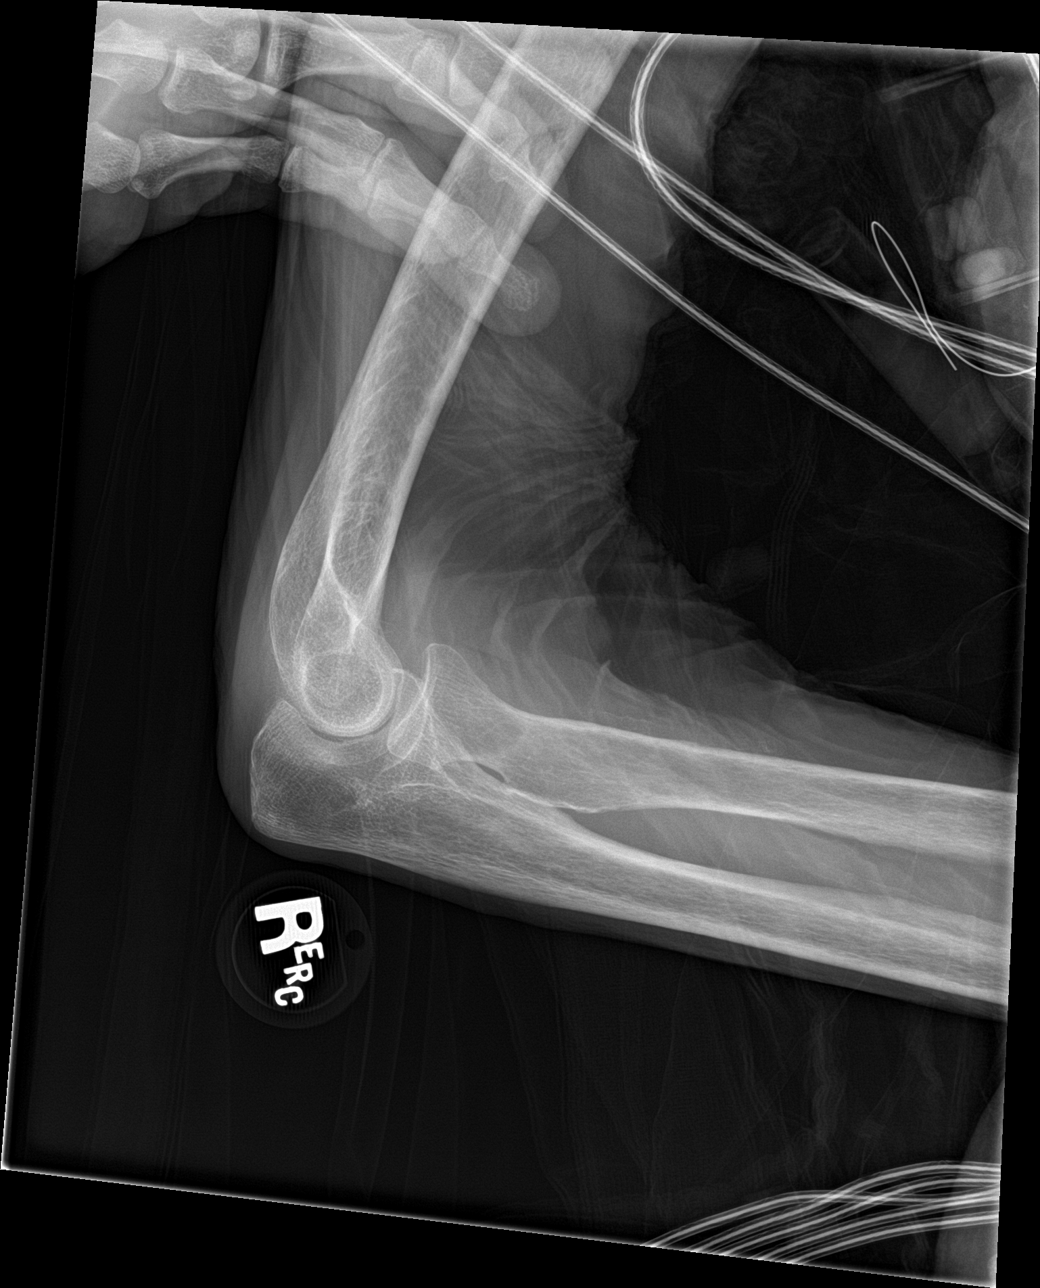

[4 of 4 positions shown; findings below may reference images not displayed]

FINDINGS: No acute fracture dislocation. No visible joint effusion. Mild
degenerative spurring noted about the elbow. Osteopenia. No visible
soft tissue injury.
IMPRESSION: 1. No acute fracture or dislocation.
2. Mild degenerative osteoarthrosis.
